# Patient Record
Sex: Female | Born: 1965 | Race: Black or African American | Hispanic: No | Marital: Single | Smoking: Never smoker
Health system: Southern US, Community
[De-identification: ages and names within clinical notes are randomized; demographics above are authoritative.]

## PROBLEM LIST (undated history)

## (undated) DIAGNOSIS — I10 Essential (primary) hypertension: Secondary | ICD-10-CM

## (undated) DIAGNOSIS — E119 Type 2 diabetes mellitus without complications: Secondary | ICD-10-CM

## (undated) DIAGNOSIS — K219 Gastro-esophageal reflux disease without esophagitis: Secondary | ICD-10-CM

## (undated) DIAGNOSIS — Z5189 Encounter for other specified aftercare: Secondary | ICD-10-CM

## (undated) DIAGNOSIS — K449 Diaphragmatic hernia without obstruction or gangrene: Secondary | ICD-10-CM

## (undated) HISTORY — PX: GASTRIC BYPASS: SHX52

---

## 2006-11-01 ENCOUNTER — Ambulatory Visit: Payer: Self-pay

## 2006-11-09 ENCOUNTER — Inpatient Hospital Stay: Payer: Self-pay

## 2012-01-03 ENCOUNTER — Ambulatory Visit: Payer: Self-pay | Admitting: Surgery

## 2012-03-30 ENCOUNTER — Emergency Department: Payer: Self-pay | Admitting: Emergency Medicine

## 2012-08-23 ENCOUNTER — Emergency Department: Payer: Self-pay | Admitting: Internal Medicine

## 2018-01-22 ENCOUNTER — Other Ambulatory Visit: Payer: Self-pay | Admitting: Family Medicine

## 2018-01-22 DIAGNOSIS — Z1231 Encounter for screening mammogram for malignant neoplasm of breast: Secondary | ICD-10-CM

## 2018-05-19 ENCOUNTER — Emergency Department
Admission: EM | Admit: 2018-05-19 | Discharge: 2018-05-19 | Disposition: A | Discharge status: Home / Self Care | Payer: BC Managed Care – PPO | Attending: Emergency Medicine | Admitting: Emergency Medicine

## 2018-05-19 ENCOUNTER — Emergency Department: Payer: BC Managed Care – PPO

## 2018-05-19 ENCOUNTER — Other Ambulatory Visit: Payer: Self-pay

## 2018-05-19 DIAGNOSIS — R202 Paresthesia of skin: Secondary | ICD-10-CM | POA: Diagnosis not present

## 2018-05-19 DIAGNOSIS — H9202 Otalgia, left ear: Secondary | ICD-10-CM

## 2018-05-19 DIAGNOSIS — R030 Elevated blood-pressure reading, without diagnosis of hypertension: Secondary | ICD-10-CM

## 2018-05-19 DIAGNOSIS — M25512 Pain in left shoulder: Secondary | ICD-10-CM | POA: Diagnosis present

## 2018-05-19 HISTORY — DX: Diaphragmatic hernia without obstruction or gangrene: K44.9

## 2018-05-19 HISTORY — DX: Gastro-esophageal reflux disease without esophagitis: K21.9

## 2018-05-19 LAB — CBC WITH DIFFERENTIAL/PLATELET
Abs Immature Granulocytes: 0.02 10*3/uL (ref 0.00–0.07)
Basophils Absolute: 0 10*3/uL (ref 0.0–0.1)
Basophils Relative: 0 %
Eosinophils Absolute: 0.1 10*3/uL (ref 0.0–0.5)
Eosinophils Relative: 1 %
HCT: 42.6 % (ref 36.0–46.0)
Hemoglobin: 13.5 g/dL (ref 12.0–15.0)
Immature Granulocytes: 0 %
Lymphocytes Relative: 20 %
Lymphs Abs: 1.5 10*3/uL (ref 0.7–4.0)
MCH: 28 pg (ref 26.0–34.0)
MCHC: 31.7 g/dL (ref 30.0–36.0)
MCV: 88.4 fL (ref 80.0–100.0)
Monocytes Absolute: 0.6 10*3/uL (ref 0.1–1.0)
Monocytes Relative: 7 %
NRBC: 0 % (ref 0.0–0.2)
Neutro Abs: 5.6 10*3/uL (ref 1.7–7.7)
Neutrophils Relative %: 72 %
Platelets: 292 10*3/uL (ref 150–400)
RBC: 4.82 MIL/uL (ref 3.87–5.11)
RDW: 12.4 % (ref 11.5–15.5)
WBC: 7.8 10*3/uL (ref 4.0–10.5)

## 2018-05-19 LAB — TROPONIN I: Troponin I: <0.03 ng/mL (ref <0.03)

## 2018-05-19 LAB — BASIC METABOLIC PANEL
Anion gap: 6 (ref 5–15)
BUN: 14 mg/dL (ref 6–20)
CALCIUM: 9.1 mg/dL (ref 8.9–10.3)
CO2: 27 mmol/L (ref 22–32)
Chloride: 104 mmol/L (ref 98–111)
Creatinine, Ser: 0.81 mg/dL (ref 0.44–1.00)
GFR calc Af Amer: >60 mL/min (ref >60)
GFR calc non Af Amer: >60 mL/min (ref >60)
Glucose, Bld: 99 mg/dL (ref 70–99)
Potassium: 3.6 mmol/L (ref 3.5–5.1)
Sodium: 137 mmol/L (ref 135–145)

## 2018-05-19 MED ORDER — LORAZEPAM 2 MG/ML IJ SOLN
1.0000 mg | Freq: Once | INTRAMUSCULAR | Status: AC
  Administered 2018-05-19: 1 mg via INTRAVENOUS
  Filled 2018-05-19: qty 1

## 2018-05-19 MED ORDER — KETOROLAC TROMETHAMINE 30 MG/ML IJ SOLN
15.0000 mg | Freq: Once | INTRAMUSCULAR | Status: AC
  Administered 2018-05-19: 15 mg via INTRAVENOUS
  Filled 2018-05-19: qty 1

## 2018-05-19 NOTE — ED Provider Notes (Addendum)
[facility] Emergency Department Provider Note  ____________________________________________   I have reviewed the triage vital signs and the nursing notes. Where available I have reviewed prior notes and, if possible and indicated, outside hospital notes.    HISTORY  Chief Complaint Arm Pain    HPI Monica Chen is a 52 y.o. female  Who states she has a history of anxiety and "stress", presents today complaining of 2 things, when I asked her what actually is the most compelling reason she is here, she states that she has a pain in her left shoulder.  Been there for about 12 to 14 hours.  She has been doing a little bit of heavy lifting and thinks she may have strained it.  She denies any chest pain.  She has no exertional symptoms she has no shortness of breath the pain is not pleuritic, does hurt to move her arm or touch her left shoulder.  She is had no fever or chills no arm swelling.  Patient states that it is a sharp discomfort is okay when she does not move it is worse when she does.  She has had "pinched nerves" in the past but this is different.  Patient states she is been very stressed about it but because she has not eaten anything yet today, she has not taken any pain medication even Tylenol for it.  She states that she has had no weakness.  She states that for months she has occasionally in the masseter muscle of her right face a slight tingling sensation.  This does not involve her entire face is very isolated to the area around the edge of her mouth.  Comes and goes, she states it seems to have some he do with stress.  She states she had a little bit this morning.  She had no other neurologic symptoms.  She and I talked about whether or not this could be a stroke and she is adamant that she does not want a CT scan.  She states "I have always had this".  She does mention also that she has had some left ear discomfort over the last couple days however that seems  to be mostly gone but she wants it "checked out".  Finally, she states that she was very anxious because her blood pressure was elevated she went to the [place] clinic.    Past Medical History:  Diagnosis Date  . Acid reflux   . Hiatal hernia     There are no active problems to display for this patient.     Prior to Admission medications   Not on File    Allergies Patient has no known allergies.  No family history on file.  Social History Social History   Tobacco Use  . Smoking status: Never Smoker  Substance Use Topics  . Alcohol use: Not Currently  . Drug use: Not on file    Review of Systems Constitutional: No fever/chills Eyes: No visual changes. ENT: No sore throat. No stiff neck no neck pain Cardiovascular: Denies chest pain. Respiratory: Denies shortness of breath. Gastrointestinal:   no vomiting.  No diarrhea.  No constipation. Genitourinary: Negative for dysuria. Musculoskeletal: Negative lower extremity swelling Skin: Negative for rash. Neurological: Negative for severe headaches, focal weakness difficulty speaking or any other signs of CVA   ____________________________________________   PHYSICAL EXAM:  VITAL SIGNS: ED Triage Vitals  Enc Vitals Group     BP 05/19/18 1149 (!) 193/119     Pulse  Rate 05/19/18 1148 76     Resp 05/19/18 1149 18     Temp 05/19/18 1148 97.7 F (36.5 C)     Temp Source 05/19/18 1148 Oral     SpO2 05/19/18 1148 97 %     Weight 05/19/18 1149 207 lb (93.9 kg)     Height 05/19/18 1149 5\' 5"  (1.651 m)     Head Circumference --      Peak Flow --      Pain Score 05/19/18 1149 10     Pain Loc --      Pain Edu? --      Excl. in [place]? --     Constitutional: Alert and oriented. Well appearing and in no acute distress.  Patient is very anxious Eyes: Conjunctivae are normal Head: Atraumatic HEENT: No congestion/rhinnorhea. Mucous membranes are moist.  Oropharynx non-erythematous, TMs are normal bilaterally Neck:    Nontender with no meningismus, no masses, no stridor Cardiovascular: Normal rate, regular rhythm. Grossly normal heart sounds.  Good peripheral circulation. Respiratory: Normal respiratory effort.  No retractions. Lungs CTAB. Abdominal: Soft and nontender. No distention. No guarding no rebound Back:  There is no focal tenderness or step off.  there is no midline tenderness there are no lesions noted. there is no CVA tenderness Musculoskeletal: No lower extremity tenderness, minimal tenderness to palpation around the left shoulder, it is also a little bit uncomfortable when I range the shoulder.  There is no erythema there is no obvious deformity there is strong distal pulses, compartments are soft, she has no tenderness to palpation to the wrist or elbow, there is full range of motion in all extremities, there is no evidence of DVT there is no edema, there is no evidence of compartment syndrome, compartments are soft.  Her pain is isolated to the most of the anterior part of her left shoulder.  It is not red or hot to touch.. No joint effusions, no DVT signs strong distal pulses no edema Neurologic: Cranial nerves II through XII are grossly intact 5 out of 5 strength bilateral upper and lower extremity. Finger to nose within normal limits heel to shin within normal limits, speech is normal with no word finding difficulty or dysarthria, reflexes symmetric, pupils are equally round and reactive to light, there is no pronator drift, sensation is normal, vision is intact to confrontation, gait is deferred, there is no nystagmus, normal neurologic exam.  Skin:  Skin is warm, dry and intact. No rash noted. Psychiatric: Mood and affect are very anxious. Speech and behavior are normal.  ____________________________________________   LABS (all labs ordered are listed, but only abnormal results are displayed)  Labs Reviewed  CBC WITH DIFFERENTIAL/PLATELET  BASIC METABOLIC PANEL  TROPONIN I    Pertinent  labs  results that were available during my care of the patient were reviewed by me and considered in my medical decision making (see chart for details). ____________________________________________  EKG  I personally interpreted any EKGs ordered by me or triage Sinus rhythm rate 59 bpm no acute ST elevation or depression nonspecific ST changes no acute ischemia, borderline LAD. ____________________________________________  RADIOLOGY  Pertinent labs & imaging results that were available during my care of the patient were reviewed by me and considered in my medical decision making (see chart for details). If possible, patient and/or family made aware of any abnormal findings.  No results found. ____________________________________________    PROCEDURES  Procedure(s) performed: None  Procedures  Critical Care performed: None  ____________________________________________  INITIAL IMPRESSION / ASSESSMENT AND PLAN / ED COURSE  Pertinent labs & imaging results that were available during my care of the patient were reviewed by me and considered in my medical decision making (see chart for details).  Patient here for multiple different reasons.  We will address them as they come.  The first is shoulder pain.  I do not think this represents ACS is very reproducible shoulder pain nor is or evidence of DVT, dislocation compartment syndrome, cannot definitively rule out a radicular pathology for this but this very reproducible shoulder pain is most consistent with a slight noninfected bursitis.  We will give her a pain medication for this as she is taken nothing in terms of pain for this discomfort.  It certainly does not appear to be consistent with referred cardiac pain but given her elevated blood pressure, we will send cardiac enzymes.  This is been going on for 12 hours, I think that should be sufficient for us to avoid serial troponins.  The second patient complaint is that her ear was  hurting, it looks normal to me.  It does not hurt.  The third complaint is the patient had some tingling on the masseter muscle region between her nose and her chin on the right side of her face.  There is no evidence of CVA or neurologic impairment.  She has intact sensation to light touch there.  She states this is a feeling she has had for years.  I do not think this likely represents a CVA but again given her elevated blood pressure, I did offer her a CT scan which she adamantly refused.  I do not think this is unreasonable present on think this is a stroke.  NIH stroke scale is otherwise 0  Her next complaint is that her blood pressure is high.  Patient is very anxious and uncomfortable from her shoulder, she is also anxious about all the other issues that brought her in today.  I will give her a little bit of Ativan I think that will bring down her blood pressure.  Looks like she does not have baseline hypertension.  Patient also states she is urinating more frequently than normal, this I forgot to mention in her H&P, she has no dysuria no urinary frequency but I will send  UA.  We will also check a sugar.  ----------------------------------------- 2:19 PM on 05/19/2018 -----------------------------------------  Blood pressure is coming down, patient is resting comfortably blood pressure is still elevated but is not clear what her baseline is she states that sometimes when she is stressed it goes up.  There is no evidence of hypertensive crisis, she is having no chest pain no headache no neurologic symptoms at this time, she has reproducible shoulder pain with evidence of tendinitis on x-ray.  We did offer her a urinalysis but she refuses and states she is ready to go home.  Kidney function is normal.  I do not see any indication to admit this patient to the hospital and do not think any further work-up is indicated aside for reported urinary frequency, which he has not I do not think necessarily  UTI certainly not systemically and she declines to stay for urine assessment.  She also refused CT scan is very comfortable with this choice that she has made.  Given all that, we will discharge her.  We will have her follow closely with her primary care doctor on Monday to have her blood pressure rechecked.  She has extensive  return precautions and follow-up given and understood.  After the Toradol, the pain in her shoulder is nearly gone unless she moves it the wrong way and with negative cardiac enzymes despite 12 hours of constant pain I do not think serial enzymes are indicated.  At this time, there does not appear to be clinical evidence to support the diagnosis of pulmonary embolus, dissection, myocarditis, endocarditis, pericarditis, pericardial tamponade, acute coronary syndrome, pneumothorax, pneumonia, or any other acute intrathoracic pathology that will require admission or acute intervention. Nor is there evidence of any significant intra-abdominal pathology causing this discomfort.   ____________________________________________   FINAL CLINICAL IMPRESSION(S) / ED DIAGNOSES  Final diagnoses:  None      This chart was dictated using voice recognition software.  Despite best efforts to proofread,  errors can occur which can change meaning.      Jeanmarie PlantMcShane, Mariany Mackintosh A, MD 05/19/18 1236    Jeanmarie PlantMcShane, Brenda Samano A, MD 05/19/18 [phone]

## 2018-05-19 NOTE — Discharge Instructions (Signed)
You would prefer not to have a UA or ct scan, and this is your choice but it limits our ability to further evaluate your symptoms and it is important that you stay vigilant about your health. If you have any new or worrisome symptoms.  If you have any new or worrisome symptoms with chest pain, shortness of breath, nausea, vomiting, severe headache, numbness, weakness, fever, weakness, or you feel worse in any way please return to the emergency room.  Otherwise, please follow closely with your primary care doctor on Monday.  We do note your blood pressure is somewhat elevated, but you are in pain and it is also anxiety provoking to be in the emergency department.  For this reason, we did not start blood pressure medication here but we do advise that you follow closely with primary care doctor for recheck in the next 2 days.  Since he did not wish to stay for urinalysis, is impossible for us to know if you have a UTI, this is certainly your choice but again if you have burning when you urinate, high fevers or other concerns please return.  I  wish you a Altamese CabalMerry Christmas and we hope that you have a healthy end of the year.  As far as her shoulder is concerned, there is no evidence of infection there is no evidence of fracture or dislocation.  Please follow closely with orthopedic surgery, if the pain persist.  Take Tylenol or Motrin as directed and as tolerated with..  If you have increased pain numbness or weakness, or other concerns return to the emergency department

## 2018-05-19 NOTE — ED Triage Notes (Addendum)
Pt c/o of L arm pain that began yesterday. Also c/o L ear pain and urinating a lot this AM. Denies CP. Denies injury. States pain from shoulder shooting down arm. Moving hand and fingers. Also c/o R sided face feels "weak" that began this AM. No speech changes noted. Speaking in clear complete sentences. Not c/o of vision changes.   Sent from [place]. No hx of HTN, doesn't [facility] BP meds. BP in triage 193/119, [facility].

## 2018-05-19 NOTE — ED Notes (Addendum)
Pt assisted to restroom. RN requested Urine so we can test for UTI. Pt refused and said,  "she didn't want to wait nor did she have a UTI bc she isn't having pain at all and no odor." RN notified EDP of 2nd refusal of urine sample. Pt family member at bedside was upset as pt was non compliant with care or treatment.

## 2018-05-19 NOTE — ED Notes (Signed)
Pt VS taken at this time. Head of bed lowered lights dimmed pt is resting

## 2019-05-24 ENCOUNTER — Emergency Department
Admission: EM | Admit: 2019-05-24 | Discharge: 2019-05-24 | Disposition: A | Discharge status: Home / Self Care | Payer: BC Managed Care – PPO | Attending: Student | Admitting: Student

## 2019-05-24 ENCOUNTER — Encounter: Payer: Self-pay | Admitting: Emergency Medicine

## 2019-05-24 ENCOUNTER — Emergency Department: Payer: BC Managed Care – PPO

## 2019-05-24 ENCOUNTER — Other Ambulatory Visit: Payer: Self-pay

## 2019-05-24 DIAGNOSIS — I10 Essential (primary) hypertension: Secondary | ICD-10-CM | POA: Diagnosis not present

## 2019-05-24 DIAGNOSIS — U071 COVID-19: Secondary | ICD-10-CM | POA: Insufficient documentation

## 2019-05-24 DIAGNOSIS — E119 Type 2 diabetes mellitus without complications: Secondary | ICD-10-CM | POA: Insufficient documentation

## 2019-05-24 DIAGNOSIS — R059 Cough, unspecified: Secondary | ICD-10-CM

## 2019-05-24 DIAGNOSIS — R05 Cough: Secondary | ICD-10-CM | POA: Diagnosis present

## 2019-05-24 HISTORY — DX: Essential (primary) hypertension: I10

## 2019-05-24 HISTORY — DX: Type 2 diabetes mellitus without complications: E11.9

## 2019-05-24 HISTORY — DX: Encounter for other specified aftercare: Z51.89

## 2019-05-24 MED ORDER — PREDNISONE 20 MG PO TABS: 40.0000 mg | ORAL_TABLET | Freq: Every day | ORAL | 0 refills | Status: AC

## 2019-05-24 MED ORDER — BENZONATATE 100 MG PO CAPS: 100.0000 mg | ORAL_CAPSULE | Freq: Three times a day (TID) | ORAL | 0 refills | Status: AC | PRN

## 2019-05-24 MED ORDER — PREDNISONE 20 MG PO TABS
40.0000 mg | ORAL_TABLET | Freq: Once | ORAL | Status: AC
  Administered 2019-05-24: 40 mg via ORAL
  Filled 2019-05-24: qty 2

## 2019-05-24 NOTE — ED Provider Notes (Signed)
[facility] Emergency Department Provider Note  ____________________________________________   First MD Initiated Contact with Patient 05/24/19 1202     (approximate)  I have reviewed the triage vital signs and the nursing notes.  History  Chief Complaint Cough    HPI Monica Chen is a 53 y.o. female with hx of COVID (tested positive 12/22) who presents for cough and worsening SOB.  Patient states she has been symptomatic for approximately a week and a half, including fevers, chills, body aches, fatigue, dry non-productive cough, loose stool.  Over the last several days her cough has been more severe, prompting evaluation. She has used OTC Mucinex, NyQuil, green tea, with moderate improvement in her symptoms.  No aggravating factors.  She reports associated intermittent shortness of breath with her cough.  No chest pain.  No hypoxia on arrival.  She has a history of asthma, and has been using her fluticasone/salmeterol inhaler more frequently to try to help.  She has an albuterol inhaler at home, but did not realize that the albuterol is her as needed inhaler.  She was also recently prescribed doxycycline, but self discontinued after 2 doses as she did not feel it was helping and received her positive COVID results.    Past Medical Hx Past Medical History:  Diagnosis Date  . Acid reflux   . Blood transfusion without reported diagnosis   . Diabetes mellitus without complication ([place])   . Hiatal hernia   . Hypertension     Problem List There are no problems to display for this patient.   Past Surgical Hx Past Surgical History:  Procedure Laterality Date  . GASTRIC BYPASS      Medications Prior to Admission medications   Not on File    Allergies Patient has no known allergies.  Family Hx No family history on file.  Social Hx Social History   Tobacco Use  . Smoking status: Never Smoker  . Smokeless tobacco: Never Used  Substance Use  Topics  . Alcohol use: Not Currently  . Drug use: Not on file     Review of Systems  Constitutional: + for fever, chills, body aches, fatigue. Eyes: Negative for visual changes. ENT: Negative for sore throat. Cardiovascular: Negative for chest pain. Respiratory: + for cough, shortness of breath. Gastrointestinal: Negative for nausea, vomiting. + loose stool Genitourinary: Negative for dysuria. Musculoskeletal: Negative for leg swelling. Skin: Negative for rash. Neurological: Negative for for headaches.   Physical Exam  Vital Signs: ED Triage Vitals  Enc Vitals Group     BP 05/24/19 1052 (!) 137/98     Pulse Rate 05/24/19 1052 100     Resp 05/24/19 1052 16     Temp 05/24/19 1052 99.4 F (37.4 C)     Temp Source 05/24/19 1052 Oral     SpO2 05/24/19 1052 98 %     Weight 05/24/19 1054 202 lb (91.6 kg)     Height 05/24/19 1054 5\' 5"  (1.651 m)     Head Circumference --      Peak Flow --      Pain Score 05/24/19 1054 0     Pain Loc --      Pain Edu? --      Excl. in [place]? --     Constitutional: Alert and oriented.  Head: Normocephalic. Atraumatic. Eyes: Conjunctivae clear. Sclera anicteric. Nose: No congestion. No rhinorrhea. Mouth/Throat: Wearing mask.  Neck: No stridor.   Cardiovascular: Normal rate, regular rhythm. Extremities well perfused. Respiratory:  Normal respiratory effort. Dry cough during exam.  Lungs CTAB.  Oxygen 98% on room air.  No hypoxia.  No accessory muscle use.  Able to speak in full sentences. Gastrointestinal: Soft. Non-tender. Non-distended.  Musculoskeletal: No lower extremity edema. No deformities. Neurologic:  Normal speech and language. No gross focal neurologic deficits are appreciated.  Skin: Skin is warm, dry and intact. No rash noted. Psychiatric: Mood and affect are appropriate for situation.  EKG  N/A    Radiology  CXR: IMPRESSION:  Lower lung volumes.  Mild bilateral increased pulmonary interstitial markings; difficult  to  exclude acute viral/atypical respiratory infection such as due to  COVID-19.    Procedures  Procedure(s) performed (including critical care):  Procedures   Initial Impression / Assessment and Plan / ED Course  53 y.o. female who presents to the ED for cough, SOB. COVID positive on 12/22.   Ddx: progression of COVID, asthma exacerbation, superimposed infection  Exam is reassuring, she has no hypoxia, oxygen is in the high 90s on room air.  No accessory muscle use or evidence of respiratory distress.  CXR consistent with COVID-19.  Given chest x-ray findings, and history of asthma, will plan for course of steroids.  Explained that she should be using her albuterol inhaler as needed rather than her steroid inhaler, she voices understanding of this.  Tessalon Perles as needed for cough.  She is otherwise HDS and in no evidence of respiratory distress, stable for discharge.  Patient comfortable with the plan and discharge.  Given return precautions.   Final Clinical Impression(s) / ED Diagnosis  Final diagnoses:  Cough  COVID-19       Note:  This document was prepared using Dragon voice recognition software and may include unintentional dictation errors.   Miguel Aschoff., MD 05/24/19 [phone]

## 2019-05-24 NOTE — ED Triage Notes (Signed)
Presents with cough and diff breathing  Was tested positive for COVID  Last tuesday

## 2019-05-24 NOTE — Discharge Instructions (Signed)
Thank you for letting us take care of you in the emergency department today.   Please continue to take any regular, prescribed medications. Use the ALBUTEROL inhaler as needed for cough/wheezing. You can take 2-4 puffs every 4-6 hours as needed.   New medications we have prescribed:  - Prednisone - 10 day course of steroids - Tessalon Perles - to help with cough  Please follow up with: - Your primary care doctor to review your ER visit and follow up on your symptoms.   Please return to the ER for any new or worsening symptoms, especially worsening difficulty breathing, chest pain, vomiting, etc.

## 2019-05-24 NOTE — ED Notes (Signed)
MD Monks at bedside  

## 2019-05-24 NOTE — ED Notes (Signed)
Pt verbalized understanding of discharge instructions. NAD at this time. 

## 2021-09-23 IMAGING — DX DG CHEST 1V PORT
1 series · 1 of 1 positions shown · non-contrast
Comparison: Chest radiographs 05/19/2018 and earlier.

CLINICAL DATA: 53-year-old female with cough and shortness of
breath. Positive [id number].

EXAM:
PORTABLE CHEST 1 VIEW

[chest ap]
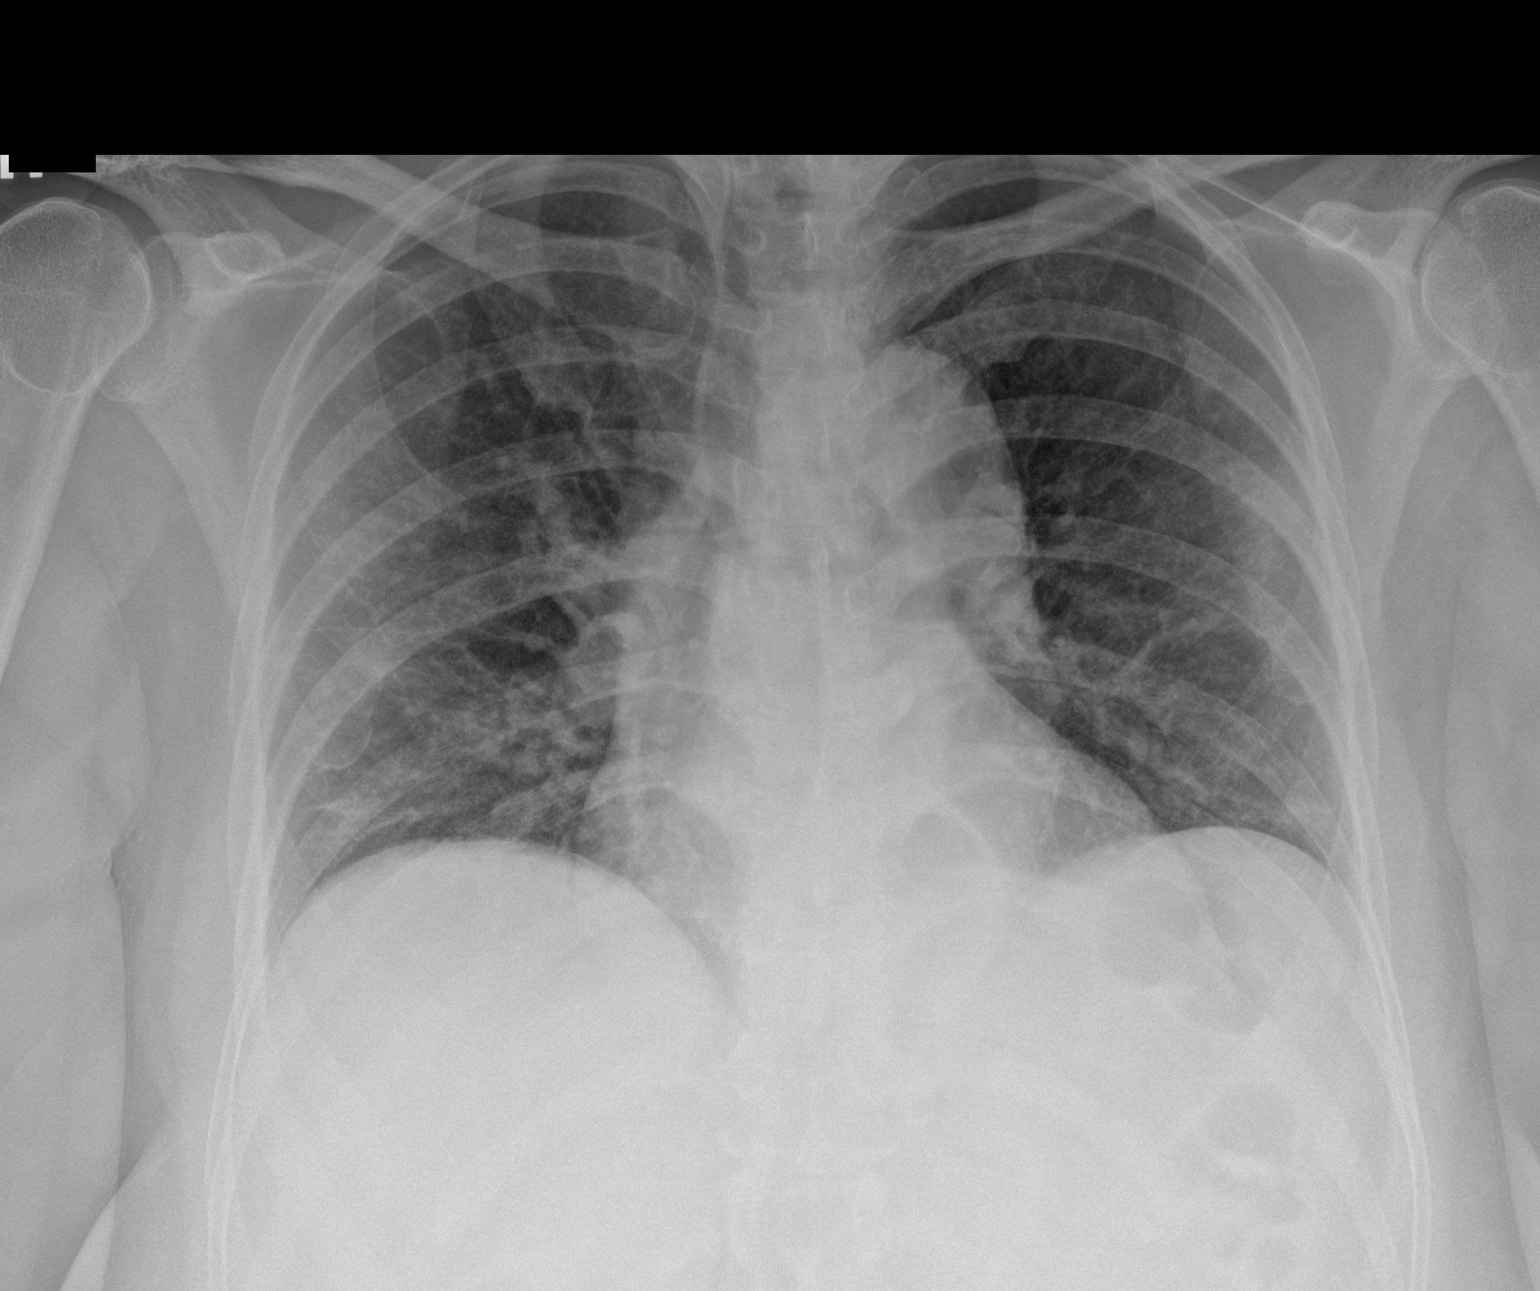

[1 of 1 positions shown; findings below may reference images not displayed]

FINDINGS: Portable AP upright view at 4223 hours. Progressive lower lung
volumes. Mediastinal contours remain normal. Visualized tracheal air
column is within normal limits.

Subtle increased bilateral pulmonary interstitial markings. Minimal
streaky opacity at the lung bases, although could be atelectasis. No
pneumothorax or pleural effusion.

Negative visible bowel gas pattern. No acute osseous abnormality
identified.
IMPRESSION: Lower lung volumes.
Mild bilateral increased pulmonary interstitial markings; difficult
to exclude acute viral/atypical respiratory infection such as due to
SEP6D-[place].

## 2021-11-07 ENCOUNTER — Emergency Department
Admission: EM | Admit: 2021-11-07 | Discharge: 2021-11-07 | Disposition: A | Discharge status: Home / Self Care | Payer: BC Managed Care – PPO | Attending: Emergency Medicine | Admitting: Emergency Medicine

## 2021-11-07 ENCOUNTER — Encounter: Payer: Self-pay | Admitting: Emergency Medicine

## 2021-11-07 ENCOUNTER — Other Ambulatory Visit: Payer: Self-pay

## 2021-11-07 DIAGNOSIS — E119 Type 2 diabetes mellitus without complications: Secondary | ICD-10-CM | POA: Diagnosis not present

## 2021-11-07 DIAGNOSIS — K2900 Acute gastritis without bleeding: Secondary | ICD-10-CM | POA: Diagnosis not present

## 2021-11-07 DIAGNOSIS — R1012 Left upper quadrant pain: Secondary | ICD-10-CM

## 2021-11-07 DIAGNOSIS — I10 Essential (primary) hypertension: Secondary | ICD-10-CM | POA: Diagnosis not present

## 2021-11-07 DIAGNOSIS — R109 Unspecified abdominal pain: Secondary | ICD-10-CM | POA: Diagnosis present

## 2021-11-07 LAB — COMPREHENSIVE METABOLIC PANEL
ALT: 15 U/L (ref 0–44)
AST: 18 U/L (ref 15–41)
Albumin: 4.2 g/dL (ref 3.5–5.0)
Alkaline Phosphatase: 67 U/L (ref 38–126)
Anion gap: 6 (ref 5–15)
BUN: 18 mg/dL (ref 6–20)
CO2: 27 mmol/L (ref 22–32)
Calcium: 9.5 mg/dL (ref 8.9–10.3)
Chloride: 108 mmol/L (ref 98–111)
Creatinine, Ser: 0.76 mg/dL (ref 0.44–1.00)
GFR, Estimated: >60 mL/min (ref >60)
Glucose, Bld: 104 mg/dL — ABNORMAL HIGH (ref 70–99)
Potassium: 3.7 mmol/L (ref 3.5–5.1)
Sodium: 141 mmol/L (ref 135–145)
Total Bilirubin: 0.6 mg/dL (ref 0.3–1.2)
Total Protein: 7.2 g/dL (ref 6.5–8.1)

## 2021-11-07 LAB — CBC
HCT: 37.8 % (ref 36.0–46.0)
Hemoglobin: 12 g/dL (ref 12.0–15.0)
MCH: 28 pg (ref 26.0–34.0)
MCHC: 31.7 g/dL (ref 30.0–36.0)
MCV: 88.3 fL (ref 80.0–100.0)
Platelets: 276 10*3/uL (ref 150–400)
RBC: 4.28 MIL/uL (ref 3.87–5.11)
RDW: 12.9 % (ref 11.5–15.5)
WBC: 10.6 10*3/uL — ABNORMAL HIGH (ref 4.0–10.5)
nRBC: 0 % (ref 0.0–0.2)

## 2021-11-07 LAB — URINALYSIS, ROUTINE W REFLEX MICROSCOPIC
Bilirubin Urine: NEGATIVE
Glucose, UA: NEGATIVE mg/dL
Hgb urine dipstick: NEGATIVE
Ketones, ur: NEGATIVE mg/dL
Leukocytes,Ua: NEGATIVE
Nitrite: NEGATIVE
Protein, ur: NEGATIVE mg/dL
Specific Gravity, Urine: 1.021 (ref 1.005–1.030)
pH: 5 (ref 5.0–8.0)

## 2021-11-07 LAB — LIPASE, BLOOD: Lipase: 53 U/L — ABNORMAL HIGH (ref 11–51)

## 2021-11-07 MED ORDER — SODIUM CHLORIDE 0.9 % IV BOLUS
1000.0000 mL | Freq: Once | INTRAVENOUS | Status: AC
  Administered 2021-11-07: 1000 mL via INTRAVENOUS

## 2021-11-07 MED ORDER — METOCLOPRAMIDE HCL 10 MG PO TABS
10.0000 mg | ORAL_TABLET | Freq: Four times a day (QID) | ORAL | 0 refills | Status: AC | PRN
Start: 2021-11-07 — End: ?

## 2021-11-07 MED ORDER — METOCLOPRAMIDE HCL 5 MG/ML IJ SOLN
10.0000 mg | INTRAMUSCULAR | Status: AC
  Administered 2021-11-07: 10 mg via INTRAVENOUS
  Filled 2021-11-07: qty 2

## 2021-11-07 MED ORDER — ONDANSETRON HCL 4 MG/2ML IJ SOLN
4.0000 mg | Freq: Once | INTRAMUSCULAR | Status: AC
Start: 2021-11-07 — End: 2021-11-07
  Administered 2021-11-07: 4 mg via INTRAVENOUS
  Filled 2021-11-07: qty 2

## 2021-11-07 MED ORDER — FAMOTIDINE 20 MG PO TABS: 20.0000 mg | ORAL_TABLET | Freq: Two times a day (BID) | ORAL | 0 refills | Status: AC

## 2021-11-07 MED ORDER — SUCRALFATE 1 G PO TABS
1.0000 g | ORAL_TABLET | Freq: Once | ORAL | Status: AC
  Administered 2021-11-07: 1 g via ORAL
  Filled 2021-11-07: qty 1

## 2021-11-07 MED ORDER — HYDROMORPHONE HCL 1 MG/ML IJ SOLN
1.0000 mg | Freq: Once | INTRAMUSCULAR | Status: AC
  Administered 2021-11-07: 1 mg via INTRAVENOUS
  Filled 2021-11-07: qty 1

## 2021-11-07 MED ORDER — ALUMINUM-MAGNESIUM-SIMETHICONE 200-200-20 MG/5ML PO SUSP: 30.0000 mL | Freq: Three times a day (TID) | ORAL | 0 refills | Status: AC

## 2021-11-07 MED ORDER — PANTOPRAZOLE SODIUM 40 MG IV SOLR
40.0000 mg | Freq: Once | INTRAVENOUS | Status: AC
  Administered 2021-11-07: 40 mg via INTRAVENOUS
  Filled 2021-11-07: qty 10

## 2021-11-07 NOTE — ED Provider Notes (Signed)
[facility] Provider Note    Event Date/Time   First MD Initiated Contact with Patient 11/07/21 1428     (approximate)   History   Chief Complaint: Abdominal Pain   HPI  Haeley Fordham is a 56 y.o. female with a history of hypertension hiatal hernia diabetes acid reflux who comes ED complaining of mid abdominal pain that started this morning, worse after eating a grilled cheese sandwich.  Associate with nausea, no vomiting.  No fever or chills, nonradiating.  No diarrhea.     Physical Exam   Triage Vital Signs: ED Triage Vitals  Enc Vitals Group     BP 11/07/21 1408 (!) 163/101     Pulse Rate 11/07/21 1408 79     Resp 11/07/21 1408 18     Temp 11/07/21 1408 98.4 F (36.9 C)     Temp src --      SpO2 11/07/21 1408 98 %     Weight 11/07/21 1408 190 lb (86.2 kg)     Height 11/07/21 1408 5\' 5"  (1.651 m)     Head Circumference --      Peak Flow --      Pain Score 11/07/21 1413 9     Pain Loc --      Pain Edu? --      Excl. in GC? --     Most recent vital signs: Vitals:   11/07/21 1511 11/07/21 1600  BP: (!) 141/86 (!) 142/80  Pulse: 74 70  Resp: 16 16  Temp:    SpO2: 99% 98%    General: Awake, no distress.  CV:  Good peripheral perfusion.  Resp:  Normal effort.  Abd:  No distention.  Soft with left upper quadrant and epigastric tenderness. Other:  Moist oral mucosa.   ED Results / Procedures / Treatments   Labs (all labs ordered are listed, but only abnormal results are displayed) Labs Reviewed  LIPASE, BLOOD - Abnormal; Notable for the following components:      Result Value   Lipase 53 (*)    All other components within normal limits  COMPREHENSIVE METABOLIC PANEL - Abnormal; Notable for the following components:   Glucose, Bld 104 (*)    All other components within normal limits  CBC - Abnormal; Notable for the following components:   WBC 10.6 (*)    All other components within normal limits  URINALYSIS, ROUTINE W  REFLEX MICROSCOPIC - Abnormal; Notable for the following components:   Color, Urine YELLOW (*)    APPearance HAZY (*)    All other components within normal limits     EKG    RADIOLOGY    PROCEDURES:  Procedures   MEDICATIONS ORDERED IN ED: Medications  sodium chloride 0.9 % bolus 1,000 mL (0 mLs Intravenous Stopped 11/07/21 1629)  ondansetron (ZOFRAN) injection 4 mg (4 mg Intravenous Given 11/07/21 1509)  HYDROmorphone (DILAUDID) injection 1 mg (1 mg Intravenous Given 11/07/21 1509)  pantoprazole (PROTONIX) injection 40 mg (40 mg Intravenous Given 11/07/21 1519)  sucralfate (CARAFATE) tablet 1 g (1 g Oral Given 11/07/21 1538)  metoCLOPramide (REGLAN) injection 10 mg (10 mg Intravenous Given 11/07/21 1538)     IMPRESSION / MDM / ASSESSMENT AND PLAN / ED COURSE  I reviewed the triage vital signs and the nursing notes.                              Differential diagnosis includes,  but is not limited to, gastritis, pancreatitis, choledocholithiasis, biliary colic.  Doubt cholecystitis appendicitis AAA dissection diverticulitis intra-abdominal abscess or mesenteric ischemia.  Patient's presentation is most consistent with acute presentation with potential threat to life or bodily function.  Patient presents with severe upper abdominal pain.  Vital signs are unremarkable, labs are essentially normal.  After giving antacids, patient is feeling much better and tolerating oral intake.  Abdomen is nonsurgical.  Presentation is consistent with gastritis, will continue acid suppression therapy and recommend follow-up with primary care.       FINAL CLINICAL IMPRESSION(S) / ED DIAGNOSES   Final diagnoses:  LUQ abdominal pain  Acute gastritis without hemorrhage, unspecified gastritis type     Rx / DC Orders   ED Discharge Orders          Ordered    metoCLOPramide (REGLAN) 10 MG tablet  Every 6 hours PRN        11/07/21 1633    famotidine (PEPCID) 20 MG tablet  2 times daily         11/07/21 1633    aluminum-magnesium hydroxide-simethicone (MAALOX) 200-200-20 MG/5ML SUSP  3 times daily before meals & bedtime        11/07/21 1633             Note:  This document was prepared using Dragon voice recognition software and may include unintentional dictation errors.   Sharman Cheek, MD 11/07/21 1705

## 2021-11-07 NOTE — ED Triage Notes (Signed)
Pt via POV from home. Pt c/o mid-abd pain. States the pain was worse after eating a grilled cheese today. Pt also intermittent nausea. Pt is has a hx of gastric sleeve. Pt is A&OX4 and NAD

## 2021-11-07 NOTE — ED Notes (Signed)
See triage note  presents with mid abd pain  states pain started after eating   states has have similar episodes in past  afebrile on arrival

## 2021-11-07 NOTE — Discharge Instructions (Signed)
Your lab tests today are all normal.  Your symptoms may be due to stomach inflammation, and can be improved by taking medications to soothe your stomach and lower your stomach acid levels.  Continue taking your medications and follow-up with your doctor this week.
